# Patient Record
Sex: Male | Born: 1985 | Race: Black or African American | Hispanic: No | Marital: Married | Smoking: Never smoker
Health system: Southern US, Community
[De-identification: ages and names within clinical notes are randomized; demographics above are authoritative.]

---

## 2011-01-15 ENCOUNTER — Inpatient Hospital Stay (INDEPENDENT_AMBULATORY_CARE_PROVIDER_SITE_OTHER)
Admission: RE | Admit: 2011-01-15 | Discharge: 2011-01-15 | Disposition: A | Discharge status: Home / Self Care | Payer: BC Managed Care – PPO | Source: Ambulatory Visit | Attending: Family Medicine | Admitting: Family Medicine

## 2011-01-15 DIAGNOSIS — J019 Acute sinusitis, unspecified: Secondary | ICD-10-CM

## 2015-01-13 ENCOUNTER — Encounter (HOSPITAL_COMMUNITY): Payer: Self-pay | Admitting: Emergency Medicine

## 2015-01-13 DIAGNOSIS — W57XXXA Bitten or stung by nonvenomous insect and other nonvenomous arthropods, initial encounter: Secondary | ICD-10-CM | POA: Insufficient documentation

## 2015-01-13 DIAGNOSIS — R51 Headache: Secondary | ICD-10-CM | POA: Insufficient documentation

## 2015-01-13 DIAGNOSIS — Y998 Other external cause status: Secondary | ICD-10-CM | POA: Insufficient documentation

## 2015-01-13 DIAGNOSIS — H53149 Visual discomfort, unspecified: Secondary | ICD-10-CM | POA: Insufficient documentation

## 2015-01-13 DIAGNOSIS — T148 Other injury of unspecified body region: Secondary | ICD-10-CM | POA: Insufficient documentation

## 2015-01-13 DIAGNOSIS — Y9289 Other specified places as the place of occurrence of the external cause: Secondary | ICD-10-CM | POA: Diagnosis not present

## 2015-01-13 DIAGNOSIS — H5711 Ocular pain, right eye: Secondary | ICD-10-CM | POA: Insufficient documentation

## 2015-01-13 DIAGNOSIS — Y9389 Activity, other specified: Secondary | ICD-10-CM | POA: Insufficient documentation

## 2015-01-13 NOTE — ED Notes (Signed)
Pt. reports tick bite at right neck and right torso yesterday with chills, nausea and neck pain .

## 2015-01-14 ENCOUNTER — Emergency Department (HOSPITAL_COMMUNITY)
Admission: EM | Admit: 2015-01-14 | Discharge: 2015-01-14 | Disposition: A | Discharge status: Home / Self Care | Payer: 59 | Attending: Emergency Medicine | Admitting: Emergency Medicine

## 2015-01-14 DIAGNOSIS — W57XXXA Bitten or stung by nonvenomous insect and other nonvenomous arthropods, initial encounter: Secondary | ICD-10-CM

## 2015-01-14 MED ORDER — DOXYCYCLINE HYCLATE 100 MG PO CAPS: 100.0000 mg | ORAL_CAPSULE | Freq: Two times a day (BID) | ORAL | Status: DC

## 2015-01-14 MED ORDER — IBUPROFEN 800 MG PO TABS
800.0000 mg | ORAL_TABLET | Freq: Once | ORAL | Status: AC
Start: 2015-01-14 — End: 2015-01-14
  Administered 2015-01-14: 800 mg via ORAL
  Filled 2015-01-14: qty 1

## 2015-01-14 MED ORDER — METOCLOPRAMIDE HCL 10 MG PO TABS
10.0000 mg | ORAL_TABLET | Freq: Once | ORAL | Status: AC
  Administered 2015-01-14: 10 mg via ORAL
  Filled 2015-01-14: qty 1

## 2015-01-14 NOTE — ED Provider Notes (Signed)
CSN: [id number]     Arrival date & time 01/13/15  2311 History  This chart was scribed for Tomasita Crumble, MD by Freida Busman, ED Scribe. This patient was seen in room D30C/D30C and the patient's care was started 1:27 AM.    Chief Complaint  Patient presents with  . Insect Bite    HPI HPI Comments:  Randy Perez is a 29 y.o. male who presents to the Emergency Department complaining of a throbbing migraine HA and nausea for 3 days. He reports associated right eye pain and photophobia. Pt notes he removed 2 ticks from his person yesterday and is unsure how long the ticks had been there prior to removal. 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 No alleviating factors noted.  History reviewed. No pertinent past medical history. History reviewed. No pertinent past surgical history. No family history on file. History  Substance Use Topics  . Smoking status: Never Smoker   . Smokeless tobacco: Not on file  . Alcohol Use: No    Review of Systems  A complete 10 system review of systems was obtained and all systems are negative except as noted in the HPI and PMH.    Allergies  Review of patient's allergies indicates no known allergies.  Home Medications   Prior to Admission medications   Not on File   BP 155/111 mmHg  Pulse 98  Temp(Src) 98 F (36.7 C) (Oral)  Resp 24  Wt 268 lb 3.2 oz (121.655 kg)  SpO2 98% Physical Exam  Constitutional: He is oriented to person, place, and time. Vital signs are normal. He appears well-developed and well-nourished.  Non-toxic appearance. He does not appear ill. No distress.  HENT:  Head: Normocephalic and atraumatic.  Nose: Nose normal.  Mouth/Throat: Oropharynx is clear and moist. No oropharyngeal exudate.  Eyes: Conjunctivae and EOM are normal. Pupils are equal, round, and reactive to light. No scleral icterus.  Neck: Normal range of motion. Neck supple. No tracheal deviation, no edema, no erythema and normal range  of motion present. No thyroid mass and no thyromegaly present.  Cardiovascular: Normal rate, regular rhythm, S1 normal, S2 normal, normal heart sounds, intact distal pulses and normal pulses.  Exam reveals no gallop and no friction rub.   No murmur heard. Pulses:      Radial pulses are 2+ on the right side, and 2+ on the left side.       Dorsalis pedis pulses are 2+ on the right side, and 2+ on the left side.  Pulmonary/Chest: Effort normal and breath sounds normal. No respiratory distress. He has no wheezes. He has no rhonchi. He has no rales.  Abdominal: Soft. Normal appearance and bowel sounds are normal. He exhibits no distension, no ascites and no mass. There is no hepatosplenomegaly. There is no tenderness. There is no rebound, no guarding and no CVA tenderness.  Musculoskeletal: Normal range of motion. He exhibits no edema or tenderness.  Lymphadenopathy:    He has no cervical adenopathy.  Neurological: He is alert and oriented to person, place, and time. He has normal strength. No cranial nerve deficit or sensory deficit.  Skin: Skin is warm, dry and intact. No petechiae and no rash noted. He is not diaphoretic. No erythema. No pallor.  Psychiatric: He has a normal mood and affect. His behavior is normal. Judgment normal.  Nursing note and vitals reviewed.   ED Course  Procedures   DIAGNOSTIC STUDIES:  Oxygen Saturation is 98% on  RA, normal by my interpretation.    COORDINATION OF CARE:  1:32 AM Discussed treatment plan with pt at bedside and pt agreed to plan.  Labs Review Labs Reviewed - No data to display  Imaging Review No results found.   EKG Interpretation None      MDM   Final diagnoses:  None   Patient presents to the ED after finding a tick on his body.  He has since had HA and N/V.  Possible consistent with tick bourne illness.  No rash on exam.  Will give Rx for doxycycline but advised for watch and wait.  Tick was removed today and symptoms may improve  without intervention.  Advised to take motrin or tylenol for HA during the interval.  He appears well and in NAD.  His VS remain within his normal limits and he is safe for DC.  I personally performed the services described in this documentation, which was scribed in my presence. The recorded information has been reviewed and is accurate.    Tomasita CrumbleAdeleke Merisa Julio, MD 01/14/15 1341

## 2015-01-14 NOTE — Discharge Instructions (Signed)
Tick Bite Information Randy Perez, take ibuprofen $RemoveBefore'800mg'DOTiJdixnjTra$  as needed for headache up to 3 times per day.  Wait 2 days to see if your symptoms improve.  If not, start doxycycline antibiotic.  Be aware there are side effects in the sun and on an empty stomach.  Do not do these things while on the antibiotics.  See a primary care physician within 3 days for close follow-up. If symptoms worsen come back to the emergency department immediately. Thank you. Ticks are insects that attach themselves to the skin. There are many types of ticks. Common types include wood ticks and deer ticks. Sometimes, ticks carry diseases that can make a person very ill. The most common places for ticks to attach themselves are the scalp, neck, armpits, waist, and groin.  HOW CAN YOU PREVENT TICK BITES? Take these steps to help prevent tick bites when you are outdoors:  Wear long sleeves and long pants.  Wear white clothes so you can see ticks more easily.  Tuck your pant legs into your socks.  If walking on a trail, stay in the middle of the trail to avoid brushing against bushes.  Avoid walking through areas with long grass.  Put bug spray on all skin that is showing and along boot tops, pant legs, and sleeve cuffs.  Check clothes, hair, and skin often and before going inside.  Brush off any ticks that are not attached.  Take a shower or bath as soon as possible after being outdoors. HOW SHOULD YOU REMOVE A TICK? Ticks should be removed as soon as possible to help prevent diseases. 1. If latex gloves are available, put them on before trying to remove a tick. 2. Use tweezers to grasp the tick as close to the skin as possible. You may also use curved forceps or a tick removal tool. Grasp the tick as close to its head as possible. Avoid grasping the tick on its body. 3. Pull gently upward until the tick lets go. Do not twist the tick or jerk it suddenly. This may break off the tick's head or mouth parts. 4. Do not  squeeze or crush the tick's body. This could force disease-carrying fluids from the tick into your body. 5. After the tick is removed, wash the bite area and your hands with soap and water or alcohol. 6. Apply a small amount of antiseptic cream or ointment to the bite site. 7. Wash any tools that were used. Do not try to remove a tick by applying a hot match, petroleum jelly, or fingernail polish to the tick. These methods do not work. They may also increase the chances of disease being spread from the tick bite. WHEN SHOULD YOU SEEK HELP? Contact your health care provider if you are unable to remove a tick or if a part of the tick breaks off in the skin. After a tick bite, you need to watch for signs and symptoms of diseases that can be spread by ticks. Contact your health care provider if you develop any of the following:  Fever.  Rash.  Redness and puffiness (swelling) in the area of the tick bite.  Tender, puffy lymph glands.  Watery poop (diarrhea).  Weight loss.  Cough.  Feeling more tired than normal (fatigue).  Muscle, joint, or bone pain.  Belly (abdominal) pain.  Headache.  Change in your level of consciousness.  Trouble walking or moving your legs.  Loss of feeling (numbness) in the legs.  Loss of movement (paralysis).  Shortness  of breath.  Confusion.  Throwing up (vomiting) many times. Document Released: 09/12/2009 Document Revised: 02/18/2013 Document Reviewed: 11/26/2012 [facility] Patient Information 2015 [place], [place]. This information is not intended to replace advice given to you by your health care provider. Make sure you discuss any questions you have with your health care provider.  Doxycycline tablets or capsules What is this medicine? DOXYCYCLINE (dox i SYE kleen) is a tetracycline antibiotic. It kills certain bacteria or stops their growth. It is used to treat many kinds of infections, like dental, skin, respiratory, and urinary tract  infections. It also treats acne, Lyme disease, malaria, and certain sexually transmitted infections. This medicine may be used for other purposes; ask your health care provider or pharmacist if you have questions. COMMON BRAND NAME(S): Acticlate, Adoxa, Adoxa CK, Adoxa Pak, Adoxa TT, Alodox, Avidoxy, Doxal, Monodox, Morgidox 1x, Morgidox 1x Kit, Morgidox 2x, Morgidox 2x Kit, Ocudox, Vibra-Tabs, Vibramycin What should I tell my health care provider before I take this medicine? They need to know if you have any of these conditions: -liver disease -long exposure to sunlight like working outdoors -stomach problems like colitis -an unusual or allergic reaction to doxycycline, tetracycline antibiotics, other medicines, foods, dyes, or preservatives -pregnant or trying to get pregnant -breast-feeding How should I use this medicine? Take this medicine by mouth with a full glass of water. Follow the directions on the prescription label. It is best to take this medicine without food, but if it upsets your stomach take it with food. Take your medicine at regular intervals. Do not take your medicine more often than directed. Take all of your medicine as directed even if you think you are better. Do not skip doses or stop your medicine early. Talk to your pediatrician regarding the use of this medicine in children. Special care may be needed. While this drug may be prescribed for children as young as 38 years old for selected conditions, precautions do apply. Overdosage: If you think you have taken too much of this medicine contact a poison control center or emergency room at once. NOTE: This medicine is only for you. Do not share this medicine with others. What if I miss a dose? If you miss a dose, take it as soon as you can. If it is almost time for your next dose, take only that dose. Do not take double or extra doses. What may interact with this medicine? -antacids -barbiturates -birth control  pills -bismuth subsalicylate -carbamazepine -methoxyflurane -other antibiotics -phenytoin -vitamins that contain iron -warfarin This list may not describe all possible interactions. Give your health care provider a list of all the medicines, herbs, non-prescription drugs, or dietary supplements you use. Also tell them if you smoke, drink alcohol, or use illegal drugs. Some items may interact with your medicine. What should I watch for while using this medicine? Tell your doctor or health care professional if your symptoms do not improve. Do not treat diarrhea with over the counter products. Contact your doctor if you have diarrhea that lasts more than 2 days or if it is severe and watery. Do not take this medicine just before going to bed. It may not dissolve properly when you lay down and can cause pain in your throat. Drink plenty of fluids while taking this medicine to also help reduce irritation in your throat. This medicine can make you more sensitive to the sun. Keep out of the sun. If you cannot avoid being in the sun, wear protective clothing and use sunscreen.  Do not use sun lamps or tanning beds/booths. Birth control pills may not work properly while you are taking this medicine. Talk to your doctor about using an extra method of birth control. If you are being treated for a sexually transmitted infection, avoid sexual contact until you have finished your treatment. Your sexual partner may also need treatment. Avoid antacids, aluminum, calcium, magnesium, and iron products for 4 hours before and 2 hours after taking a dose of this medicine. If you are using this medicine to prevent malaria, you should still protect yourself from contact with mosquitos. Stay in screened-in areas, use mosquito nets, keep your body covered, and use an insect repellent. What side effects may I notice from receiving this medicine? Side effects that you should report to your doctor or health care professional  as soon as possible: -allergic reactions like skin rash, itching or hives, swelling of the face, lips, or tongue -difficulty breathing -fever -itching in the rectal or genital area -pain on swallowing -redness, blistering, peeling or loosening of the skin, including inside the mouth -severe stomach pain or cramps -unusual bleeding or bruising -unusually weak or tired -yellowing of the eyes or skin Side effects that usually do not require medical attention (report to your doctor or health care professional if they continue or are bothersome): -diarrhea -loss of appetite -nausea, vomiting This list may not describe all possible side effects. Call your doctor for medical advice about side effects. You may report side effects to FDA at 1-800-FDA-1088. Where should I keep my medicine? Keep out of the reach of children. Store at room temperature, below 30 degrees C (86 degrees F). Protect from light. Keep container tightly closed. Throw away any unused medicine after the expiration date. Taking this medicine after the expiration date can make you seriously ill. NOTE: This sheet is a summary. It may not cover all possible information. If you have questions about this medicine, talk to your doctor, pharmacist, or health care provider.  2015, [employer]. (2013-04-24 13:58:06)

## 2015-08-03 ENCOUNTER — Emergency Department (INDEPENDENT_AMBULATORY_CARE_PROVIDER_SITE_OTHER)
Admission: EM | Admit: 2015-08-03 | Discharge: 2015-08-03 | Disposition: A | Discharge status: Home / Self Care | Payer: 59 | Source: Home / Self Care | Attending: Family Medicine | Admitting: Family Medicine

## 2015-08-03 ENCOUNTER — Encounter (HOSPITAL_COMMUNITY): Payer: Self-pay | Admitting: Emergency Medicine

## 2015-08-03 DIAGNOSIS — J069 Acute upper respiratory infection, unspecified: Secondary | ICD-10-CM | POA: Diagnosis not present

## 2015-08-03 DIAGNOSIS — B309 Viral conjunctivitis, unspecified: Secondary | ICD-10-CM | POA: Diagnosis not present

## 2015-08-03 NOTE — ED Notes (Signed)
Stuffy nose, both eyes red, both eyes are itching.  Left eye redder than right eye, tearing.  Patient reports congestion in head, no chest congestion.  Onset of symptoms was 1/30

## 2015-08-03 NOTE — ED Provider Notes (Signed)
CSN: [id number]     Arrival date & time 08/03/15  1753 History   First MD Initiated Contact with Patient 08/03/15 1952     Chief Complaint  Patient presents with  . 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 He is taking an OTC medication which is helping with his symptoms. Chief complaint for visit tonight is that of redness, itching and clear watery drainage from the left eye. He states it is interfering with his driving on his job working with [employer]. No problems with vision except when there is drainage in the eye.   History reviewed. No pertinent past medical history. History reviewed. No pertinent past surgical history. No family history on file. Social History  Substance Use Topics  . Smoking status: Never Smoker   . Smokeless tobacco: None  . Alcohol Use: No    Review of Systems  Constitutional: Negative for fever, diaphoresis, activity change and fatigue.  HENT: Positive for postnasal drip and sore throat. Negative for ear pain, facial swelling, rhinorrhea and trouble swallowing.   Eyes: Positive for discharge and redness. Negative for photophobia and pain.  Respiratory: Positive for cough. Negative for chest tightness and shortness of breath.   Cardiovascular: Negative.   Gastrointestinal: Negative.   Musculoskeletal: Negative.  Negative for neck pain and neck stiffness.  Neurological: Negative.     Allergies  Review of patient's allergies indicates no known allergies.  Home Medications   Prior to Admission medications   Medication Sig Start Date End Date Taking? Authorizing Provider  Phenylephrine-APAP-Guaifenesin (TYLENOL SINUS SEVERE PO) Take by mouth.   Yes Historical Provider, MD  doxycycline (VIBRAMYCIN) 100 MG capsule Take 1 capsule (100 mg total) by mouth 2 (two) times daily. One  po bid x 10 days Patient not taking: Reported on 08/03/2015 01/14/15   Tomasita Crumble, MD   Meds Ordered and Administered this Visit  Medications - No data to display  BP 153/82 mmHg  Pulse 105  Temp(Src) 98.1 F (36.7 C) (Oral)  Resp 18  SpO2 98% No data found.   Physical Exam  Constitutional: He is oriented to person, place, and time. He appears well-developed and well-nourished. No distress.  HENT:  Bilateral TMs are normal Oropharynx with minor erythema and scant clear PND. No exudates  Eyes:  Left lower lid conjunctivae but mildly swollen and erythematous. No purulence or other drainage. Sclera mildly injected. Right lower lid conjunctiva with minor erythema. No drainage  Neck: Normal range of motion. Neck supple.  Cardiovascular: Normal rate.   Pulmonary/Chest: Effort normal.  Musculoskeletal: Normal range of motion.  Lymphadenopathy:    He has no cervical adenopathy.  Neurological: He is alert and oriented to person, place, and time. He exhibits normal muscle tone.  Skin: Skin is warm and dry.  Nursing note and vitals reviewed.   ED Course  Procedures (including critical care time)  Labs Review Labs Reviewed - No data to display  Imaging Review No results found.   Visual Acuity Review  Right Eye Distance:   Left Eye Distance:   Bilateral Distance:    Right Eye Near:   Left Eye Near:    Bilateral Near:         MDM   1. URI (upper respiratory infection)   2. Acute viral conjunctivitis of both eyes     zaditor and naphcon-A eye drops ou as directed Cont OTC URI meds Lots  of fluids. warm compresses to eyes.       Hayden Rasmussen, NP 08/03/15 2012

## 2015-08-03 NOTE — Discharge Instructions (Signed)
Viral Conjunctivitis Zaditor eye drops, one drop each eye twice a day Naphcon-A or Vasocon-A 1 drop each eye 4 times a day. Warm compresses. Viral conjunctivitis is an inflammation of the clear membrane that covers the white part of your eye and the inner surface of your eyelid (conjunctiva). The inflammation is caused by a viral infection. The blood vessels in the conjunctiva become inflamed, causing the eye to become red or pink, and often itchy. Viral conjunctivitis can easily be passed from one person to another (contagious). CAUSES  Viral conjunctivitis is caused by a virus. A virus is a type of contagious germ. It can be spread by touching objects that have been contaminated with the virus, such as doorknobs or towels.  SYMPTOMS  Symptoms of viral conjunctivitis may include:   Eye redness.  Tearing or watery eyes.  Itchy eyes.  Burning feeling in the eyes.  Clear drainage from the eye.  Swollen eyelids.  A gritty feeling in the eye.  Light sensitivity. DIAGNOSIS  Viral conjunctivitis may be diagnosed with a medical history and physical exam. If you have discharge from your eye, the discharge may be tested to rule out other causes of conjunctivitis.  TREATMENT  Viral conjunctivitis does not respond to medicines that kill bacteria (antibiotics). Treatment for viral conjunctivitis is directed at stopping a bacterial infection from developing in addition to the viral infection. Treatment also aims to relieve your symptoms, such as itching. This may be done with antihistamine drops or other eye medicines. HOME CARE INSTRUCTIONS  Take medicines only as directed by your health care provider.  Avoid touching or rubbing your eyes.  Apply a warm, clean washcloth to your eye for 10-20 minutes, 3-4 times per day.  If you wear contact lenses, do not wear them until the inflammation is gone and your health care provider says it is safe to wear them again. Ask your health care provider  how to sterilize or replace your contact lenses before using them again. Wear glasses until you can resume wearing contacts.  Avoid wearing eye makeup until the inflammation is gone. Throw away any old eye cosmetics that may be contaminated.  Change or wash your pillowcase every day.  Do not share towels or washcloths. This may spread the infection.  Wash your hands often with soap and water. Use paper towels to dry your hands.  Gently wipe away any drainage from your eye with a warm, wet washcloth or a cotton ball.  Be very careful to avoid touching the edge of the eyelid with the eye drop bottle or ointment tube when applying medicines to the affected eye. This will stop you from spreading the infection to the other eye or to other people. SEEK MEDICAL CARE IF:   Your symptoms do not improve with treatment.  You have increased pain.  Your vision becomes blurry.  You have a fever.  You have facial pain, redness, or swelling.  You have new symptoms.  Your symptoms get worse.   This information is not intended to replace advice given to you by your health care provider. Make sure you discuss any questions you have with your health care provider.   Document Released: 09/08/2002 Document Revised: 12/10/2005 Document Reviewed: 03/30/2014 Elsevier Interactive Patient Education 2016 [employer].     How to Use Eye Drops and Eye Ointments HOW TO APPLY EYE DROPS Follow these steps when applying eye drops:  Wash your hands.  Tilt your head back.  Put a finger under your  eye and use it to gently pull your lower lid downward. Keep that finger in place.  Using your other hand, hold the dropper between your thumb and index finger.  Position the dropper just over the edge of the lower lid. Hold it as close to your eye as you can without touching the dropper to your eye.  Steady your hand. One way to do this is to lean your index finger against your brow.  Look up.  Slowly  and gently squeeze one drop of medicine into your eye.  Close your eye.  Place a finger between your lower eyelid and your nose. Press gently for 2 minutes. This increases the amount of time that the medicine is exposed to the eye. It also reduces side effects that can develop if the drop gets into the bloodstream through the nose. HOW TO APPLY EYE OINTMENTS Follow these steps when applying eye ointments:  Wash your hands.  Put a finger under your eye and use it to gently pull your lower lid downward. Keep that finger in place.  Using your other hand, place the tip of the tube between your thumb and index finger with the remaining fingers braced against your cheek or nose.  Hold the tube just over the edge of your lower lid without touching the tube to your lid or eyeball.  Look up.  Line the inner part of your lower lid with ointment.  Gently pull up on your upper lid and look down. This will force the ointment to spread over the surface of the eye.  Release the upper lid.  If you can, close your eyes for 1-2 minutes. Do not rub your eyes. If you applied the ointment correctly, your vision will be blurry for a few minutes. This is normal. ADDITIONAL INFORMATION  Make sure to use the eye drops or ointment as told by your health care provider.  If you have been told to use both eye drops and an eye ointment, apply the eye drops first, then wait 3-4 minutes before you apply the ointment.  Try not to touch the tip of the dropper or tube to your eye. A dropper or tube that has touched the eye can become contaminated.   This information is not intended to replace advice given to you by your health care provider. Make sure you discuss any questions you have with your health care provider.   Document Released: 09/24/2000 Document Revised: 11/02/2014 Document Reviewed: 06/14/2014 Elsevier Interactive Patient Education 2016 [employer]  Upper Respiratory Infection, Adult Most upper  respiratory infections (URIs) are a viral infection of the air passages leading to the lungs. A URI affects the nose, throat, and upper air passages. The most common type of URI is nasopharyngitis and is typically referred to as "the common cold." URIs run their course and usually go away on their own. Most of the time, a URI does not require medical attention, but sometimes a bacterial infection in the upper airways can follow a viral infection. This is called a secondary infection. Sinus and middle ear infections are common types of secondary upper respiratory infections. Bacterial pneumonia can also complicate a URI. A URI can worsen asthma and chronic obstructive pulmonary disease (COPD). Sometimes, these complications can require emergency medical care and may be life threatening.  CAUSES Almost all URIs are caused by viruses. A virus is a type of germ and can spread from one person to another.  RISKS FACTORS You may be at risk for  a URI if:   You smoke.   You have chronic heart or lung disease.  You have a weakened defense (immune) system.   You are very young or very old.   You have nasal allergies or asthma.  You work in crowded or poorly ventilated areas.  You work in health care facilities or schools. SIGNS AND SYMPTOMS  Symptoms typically develop 2-3 days after you come in contact with a cold virus. Most viral URIs last 7-10 days. However, viral URIs from the influenza virus (flu virus) can last 14-18 days and are typically more severe. Symptoms may include:   Runny or stuffy (congested) nose.   Sneezing.   Cough.   Sore throat.   Headache.   Fatigue.   Fever.   Loss of appetite.   Pain in your forehead, behind your eyes, and over your cheekbones (sinus pain).  Muscle aches.  DIAGNOSIS  Your health care provider may diagnose a URI by:  Physical exam.  Tests to check that your symptoms are not due to another condition such as:  Strep  throat.  Sinusitis.  Pneumonia.  Asthma. TREATMENT  A URI goes away on its own with time. It cannot be cured with medicines, but medicines may be prescribed or recommended to relieve symptoms. Medicines may help:  Reduce your fever.  Reduce your cough.  Relieve nasal congestion. HOME CARE INSTRUCTIONS   Take medicines only as directed by your health care provider.   Gargle warm saltwater or take cough drops to comfort your throat as directed by your health care provider.  Use a warm mist humidifier or inhale steam from a shower to increase air moisture. This may make it easier to breathe.  Drink enough fluid to keep your urine clear or pale yellow.   Eat soups and other clear broths and maintain good nutrition.   Rest as needed.   Return to work when your temperature has returned to normal or as your health care provider advises. You may need to stay home longer to avoid infecting others. You can also use a face mask and careful hand washing to prevent spread of the virus.  Increase the usage of your inhaler if you have asthma.   Do not use any tobacco products, including cigarettes, chewing tobacco, or electronic cigarettes. If you need help quitting, ask your health care provider. PREVENTION  The best way to protect yourself from getting a cold is to practice good hygiene.   Avoid oral or hand contact with people with cold symptoms.   Wash your hands often if contact occurs.  There is no clear evidence that vitamin C, vitamin E, echinacea, or exercise reduces the chance of developing a cold. However, it is always recommended to get plenty of rest, exercise, and practice good nutrition.  SEEK MEDICAL CARE IF:   You are getting worse rather than better.   Your symptoms are not controlled by medicine.   You have chills.  You have worsening shortness of breath.  You have brown or red mucus.  You have yellow or brown nasal discharge.  You have pain in your  face, especially when you bend forward.  You have a fever.  You have swollen neck glands.  You have pain while swallowing.  You have white areas in the back of your throat. SEEK IMMEDIATE MEDICAL CARE IF:   You have severe or persistent:  Headache.  Ear pain.  Sinus pain.  Chest pain.  You have chronic lung disease and  any of the following:  Wheezing.  Prolonged cough.  Coughing up blood.  A change in your usual mucus.  You have a stiff neck.  You have changes in your:  Vision.  Hearing.  Thinking.  Mood. MAKE SURE YOU:   Understand these instructions.  Will watch your condition.  Will get help right away if you are not doing well or get worse.   This information is not intended to replace advice given to you by your health care provider. Make sure you discuss any questions you have with your health care provider.   Document Released: 12/12/2000 Document Revised: 11/02/2014 Document Reviewed: 09/23/2013 Elsevier Interactive Patient Education [employer].

## 2016-08-25 ENCOUNTER — Ambulatory Visit (HOSPITAL_COMMUNITY)
Admission: EM | Admit: 2016-08-25 | Discharge: 2016-08-25 | Disposition: A | Discharge status: Home / Self Care | Payer: 59 | Attending: Family Medicine | Admitting: Family Medicine

## 2016-08-25 ENCOUNTER — Encounter (HOSPITAL_COMMUNITY): Payer: Self-pay | Admitting: Emergency Medicine

## 2016-08-25 ENCOUNTER — Ambulatory Visit (INDEPENDENT_AMBULATORY_CARE_PROVIDER_SITE_OTHER): Payer: 59

## 2016-08-25 DIAGNOSIS — J209 Acute bronchitis, unspecified: Secondary | ICD-10-CM

## 2016-08-25 MED ORDER — HYDROCOD POLST-CPM POLST ER 10-8 MG/5ML PO SUER: 5.0000 mL | Freq: Two times a day (BID) | ORAL | 0 refills | Status: AC | PRN

## 2016-08-25 NOTE — ED Provider Notes (Signed)
CSN: [id number]     Arrival date & time 08/25/16  1828 History   First MD Initiated Contact with Patient 08/25/16 1901     Chief Complaint  Patient presents with  . Cough   (Consider location/radiation/quality/duration/timing/severity/associated sxs/prior Treatment) Patient is healthy 31 y.o. Male, presents today for 653-month duration of cough with no improvement despite OTC cold medication including robitussin and Tylenol cold. Cough is productive. He reports the cough has gotten worse. He states the cough has gotten to the point that it is affecting his qualify of sleep at night. He otherwise denies wheezing, SOB, congestion, running nose. He does reports CP due to excessive coughing.        History reviewed. No pertinent past medical history. History reviewed. No pertinent surgical history. No family history on file. Social History  Substance Use Topics  . Smoking status: Never Smoker  . Smokeless tobacco: Never Used  . Alcohol use No    Review of Systems  Constitutional:       As stated in the HPI    Allergies  Patient has no known allergies.  Home Medications   Prior to Admission medications   Medication Sig Start Date End Date Taking? Authorizing Provider  chlorpheniramine-HYDROcodone (TUSSIONEX PENNKINETIC ER) 10-8 MG/5ML SUER Take 5 mLs by mouth every 12 (twelve) hours as needed for cough. 08/25/16 09/01/16  Lucia EstelleFeng Kilani Joffe, NP  doxycycline (VIBRAMYCIN) 100 MG capsule Take 1 capsule (100 mg total) by mouth 2 (two) times daily. One po bid x 10 days Patient not taking: Reported on 08/03/2015 01/14/15   Tomasita CrumbleAdeleke Oni, MD  Phenylephrine-APAP-Guaifenesin (TYLENOL SINUS SEVERE PO) Take by mouth.    Historical Provider, MD   Meds Ordered and Administered this Visit  Medications - No data to display  BP 136/86   Pulse 104   Temp 98.8 F (37.1 C) (Temporal)   Resp 16   Ht 5\' 8"  (1.727 m)   Wt 258 lb (117 kg)   SpO2 100%   BMI 39.23 kg/m  No data found.   Physical Exam   Constitutional: He is oriented to person, place, and time. He appears well-developed and well-nourished.  HENT:  Head: Normocephalic and atraumatic.  Right Ear: External ear normal.  Left Ear: External ear normal.  Nose: Nose normal.  Mouth/Throat: Oropharynx is clear and moist. No oropharyngeal exudate.  TM pearly gray with no erythema  Eyes: Conjunctivae are normal. Pupils are equal, round, and reactive to light.  Neck: Normal range of motion. Neck supple.  Cardiovascular: Normal rate, regular rhythm and normal heart sounds.   Pulmonary/Chest: Breath sounds normal. No respiratory distress. He has no wheezes.  Abdominal: Soft. Bowel sounds are normal. He exhibits no distension. There is no tenderness.  Lymphadenopathy:    He has no cervical adenopathy.  Neurological: He is alert and oriented to person, place, and time.  Skin: Skin is warm and dry.  Nursing note and vitals reviewed.   Urgent Care Course     Procedures (including critical care time)  Labs Review Labs Reviewed - No data to display  Imaging Review Dg Chest 2 View  Result Date: 08/25/2016 CLINICAL DATA:  Patient states that he's had cough x 3 weeks, non-productive cough. chest pain with cough. Sinus congestion. Non-smoker EXAM: CHEST  2 VIEW COMPARISON:  None. FINDINGS: The heart size and mediastinal contours are within normal limits. Both lungs are clear. No pleural effusion or pneumothorax. The visualized skeletal structures are unremarkable. IMPRESSION: Normal chest radiographs. Electronically Signed  By: Amie Portland M.D.   On: 08/25/2016 19:52     MDM   1. Acute bronchitis, unspecified organism    Chest xray unremarkable.  RX tussionex given.  May use OTC mucinex.  Reviewed directions for usage and side effects. Patient states understanding and will call with questions or problems. Patient instructed to call or follow up with his/her primary care doctor if failure to improve or change in symptoms.  Discharge instruction given.     Lucia Estelle, NP 08/25/16 1958

## 2016-08-25 NOTE — ED Triage Notes (Signed)
PT reports chest congestion, sinus problems, and non productive cough for 1 month. PT reports he has tried tylenol cold and flu and robitussin

## 2017-11-07 ENCOUNTER — Emergency Department (HOSPITAL_BASED_OUTPATIENT_CLINIC_OR_DEPARTMENT_OTHER)
Admission: EM | Admit: 2017-11-07 | Discharge: 2017-11-07 | Disposition: A | Discharge status: Home / Self Care | Payer: Managed Care, Other (non HMO) | Attending: Emergency Medicine | Admitting: Emergency Medicine

## 2017-11-07 ENCOUNTER — Other Ambulatory Visit: Payer: Self-pay

## 2017-11-07 ENCOUNTER — Encounter (HOSPITAL_BASED_OUTPATIENT_CLINIC_OR_DEPARTMENT_OTHER): Payer: Self-pay

## 2017-11-07 DIAGNOSIS — Z202 Contact with and (suspected) exposure to infections with a predominantly sexual mode of transmission: Secondary | ICD-10-CM

## 2017-11-07 DIAGNOSIS — Z79899 Other long term (current) drug therapy: Secondary | ICD-10-CM | POA: Insufficient documentation

## 2017-11-07 MED ORDER — METRONIDAZOLE 500 MG PO TABS
2000.0000 mg | ORAL_TABLET | Freq: Once | ORAL | Status: AC
  Administered 2017-11-07: 2000 mg via ORAL
  Filled 2017-11-07: qty 4

## 2017-11-07 NOTE — ED Provider Notes (Signed)
[facility] EMERGENCY DEPARTMENT Provider Note   CSN: [id number] Arrival date & time: 11/07/17  2002     History   Chief Complaint Chief Complaint  Patient presents with  . Groin Injury    HPI Randy Perez is a 32 y.o. male.  HPI Patient presents to the emergency department for returns about being exposed to trichomonas.  Patient states that his wife called him today and stated that she tested positive for trichomonas.  Patient states that he has had no symptoms of any kind.  Patient is mainly concerned about how this may have occurred.  Patient states that he did not take any medications prior to arrival. History reviewed. No pertinent past medical history.  There are no active problems to display for this patient.   History reviewed. No pertinent surgical history.      Home Medications    Prior to Admission medications   Medication Sig Start Date End Date Taking? Authorizing Provider  doxycycline (VIBRAMYCIN) 100 MG capsule Take 1 capsule (100 mg total) by mouth 2 (two) times daily. One po bid x 10 days Patient not taking: Reported on 08/03/2015 01/14/15   Tomasita Crumble, MD  Phenylephrine-APAP-Guaifenesin (TYLENOL SINUS SEVERE PO) Take by mouth.    [provider]    Family History No family history on file.  Social History Social History   Tobacco Use  . Smoking status: Never Smoker  . Smokeless tobacco: Never Used  Substance Use Topics  . Alcohol use: No  . Drug use: No     Allergies   Patient has no known allergies.   Review of Systems Review of Systems All other systems negative except as documented in the HPI. All pertinent positives and negatives as reviewed in the HPI.  Physical Exam Updated Vital Signs BP (!) 160/96 (BP Location: Left Arm)   Pulse (!) 105   Temp 98.2 F (36.8 C) (Oral)   Resp 20   Wt 126.9 kg (279 lb 12.2 oz)   SpO2 100%   BMI 42.54 kg/m   Physical Exam  Constitutional: He is oriented to person,  place, and time. He appears well-developed and well-nourished. No distress.  HENT:  Head: Normocephalic and atraumatic.  Eyes: Pupils are equal, round, and reactive to light.  Pulmonary/Chest: Effort normal.  Neurological: He is alert and oriented to person, place, and time.  Skin: Skin is warm and dry.  Psychiatric: He has a normal mood and affect.  Nursing note and vitals reviewed.    ED Treatments / Results  Labs (all labs ordered are listed, but only abnormal results are displayed) Labs Reviewed - No data to display  EKG None  Radiology No results found.  Procedures Procedures (including critical care time)  Medications Ordered in ED Medications  metroNIDAZOLE (FLAGYL) tablet 2,000 mg (2,000 mg Oral Given 11/07/17 2255)     Initial Impression / Assessment and Plan / ED Course  I have reviewed the triage vital signs and the nursing notes.  Pertinent labs & imaging results that were available during my care of the patient were reviewed by me and considered in my medical decision making (see chart for details).     Patient will be treated for trichomonas.  Patient is advised to return here as needed.  Final Clinical Impressions(s) / ED Diagnoses   Final diagnoses:  None    ED Discharge Orders    None       Charlestine Night, Cordelia Poche 11/07/17 2303    Ranae Palms,  Onalee Hua, MD 11/08/17 2956

## 2017-11-07 NOTE — Discharge Instructions (Addendum)
Return here as needed.  Follow-up with your primary doctor. °

## 2017-11-07 NOTE — ED Triage Notes (Addendum)
Pt states an approx 15lb door from a lock box he was taking off at work was pulled off and injured left groin area approx 430pm today-NAD-steady gait

## 2017-11-07 NOTE — ED Notes (Signed)
Pt understood dc material. NAD noted. 

## 2018-01-26 IMAGING — DX DG CHEST 2V
2 series · 2 of 2 positions shown · non-contrast
Comparison: None.

CLINICAL DATA: Patient states that he's had cough x 3 weeks,
non-productive cough.. chest pain with cough. Sinus congestion.
Non-smoker

EXAM:
CHEST  2 VIEW

[chest pa]
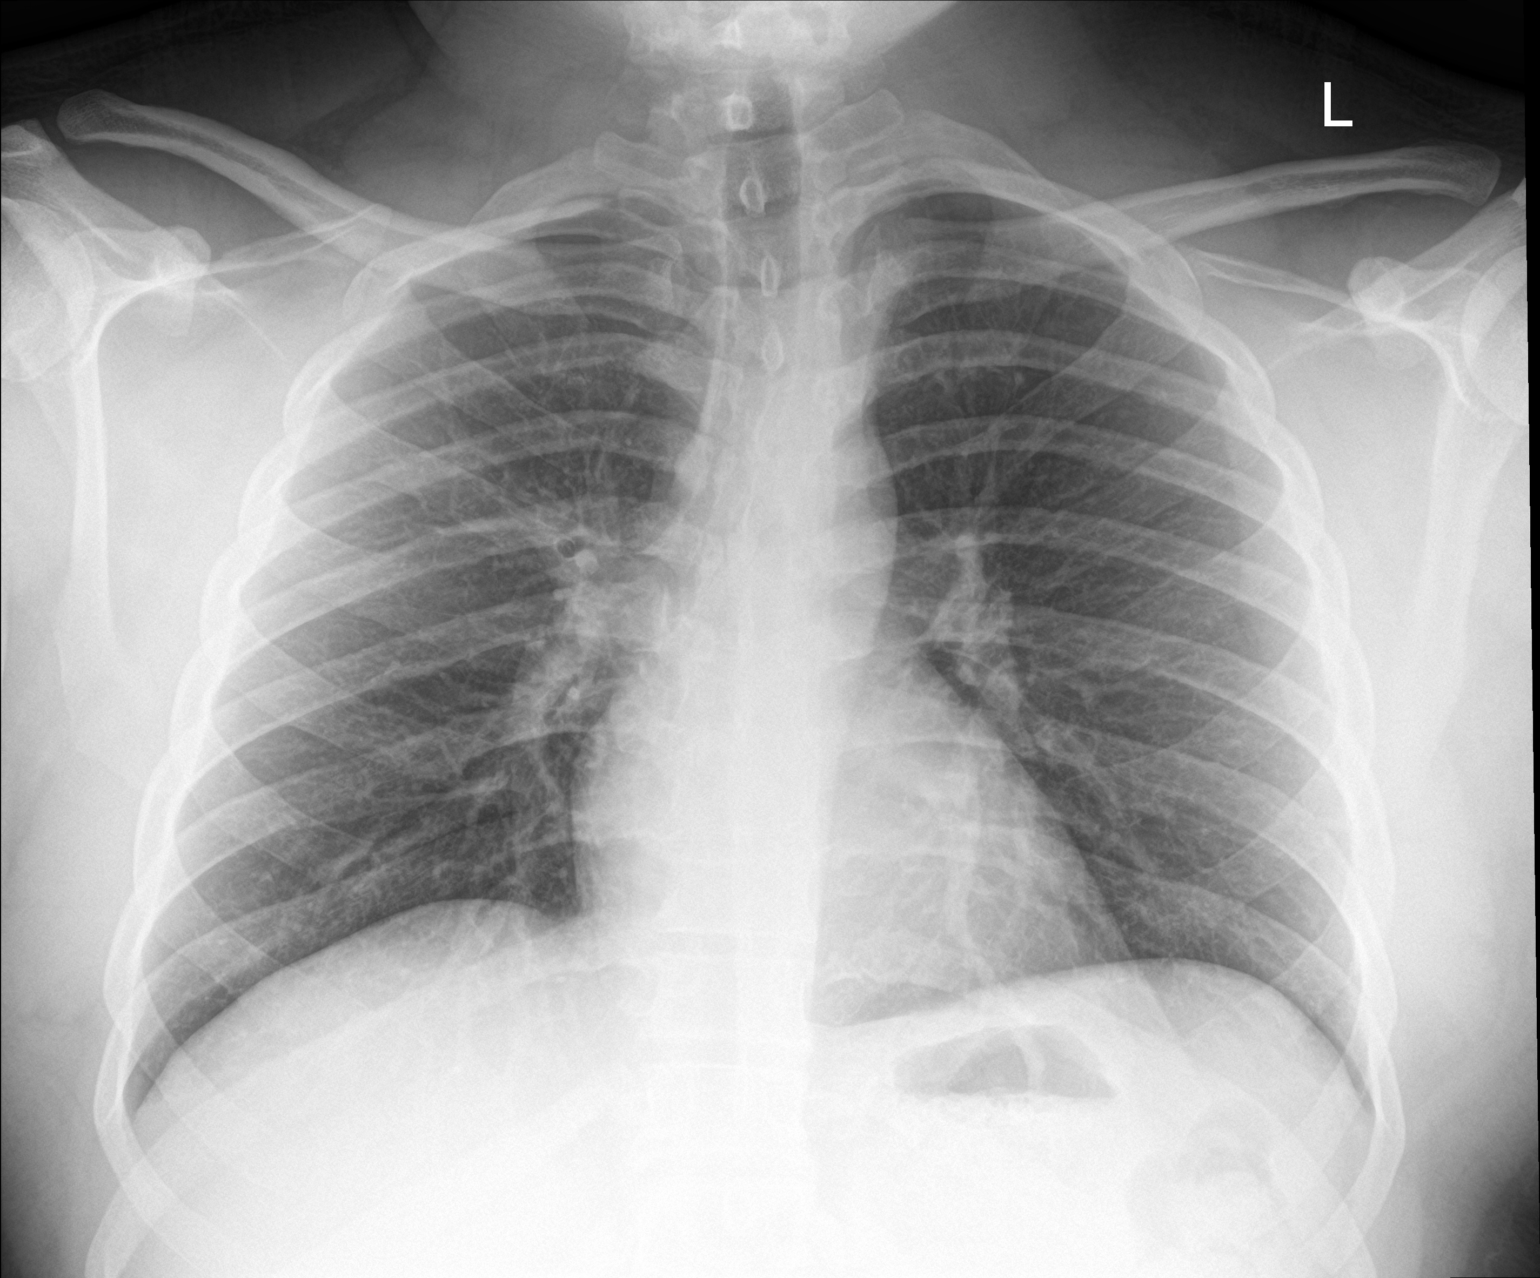

[chest lat]
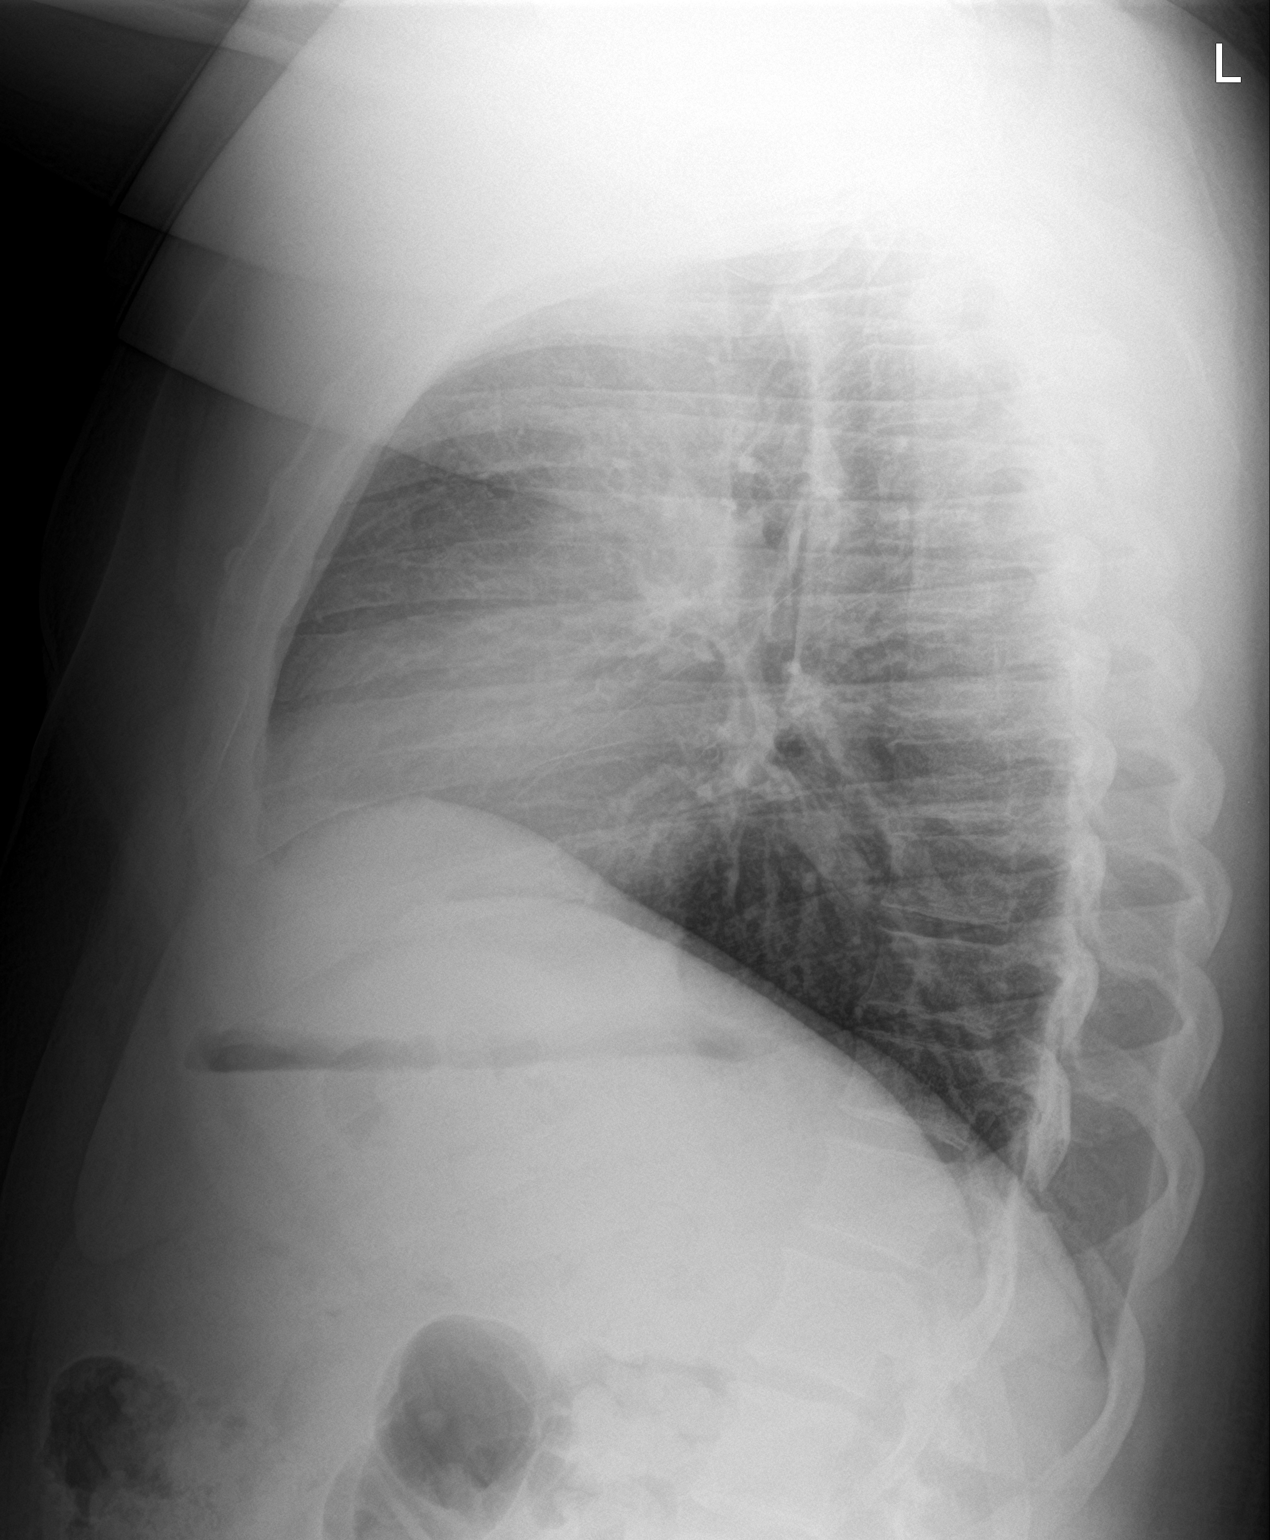

[2 of 2 positions shown; findings below may reference images not displayed]

FINDINGS: The heart size and mediastinal contours are within normal limits.
Both lungs are clear. No pleural effusion or pneumothorax. The
visualized skeletal structures are unremarkable.
IMPRESSION: Normal chest radiographs.

## 2022-08-22 ENCOUNTER — Ambulatory Visit
Admission: EM | Admit: 2022-08-22 | Discharge: 2022-08-22 | Disposition: A | Discharge status: Home / Self Care | Payer: Managed Care, Other (non HMO) | Attending: Family Medicine | Admitting: Family Medicine

## 2022-08-22 ENCOUNTER — Ambulatory Visit: Payer: Managed Care, Other (non HMO)

## 2022-08-22 DIAGNOSIS — M25561 Pain in right knee: Secondary | ICD-10-CM

## 2022-08-22 MED ORDER — DICLOFENAC SODIUM 75 MG PO TBEC: 75.0000 mg | DELAYED_RELEASE_TABLET | Freq: Two times a day (BID) | ORAL | 0 refills | Status: DC

## 2022-08-22 MED ORDER — PREDNISONE 20 MG PO TABS: 40.0000 mg | ORAL_TABLET | Freq: Every day | ORAL | 0 refills | Status: DC

## 2022-08-22 NOTE — ED Provider Notes (Signed)
Randy Perez   [id number] 08/22/22 Arrival Time: 0932  ASSESSMENT & PLAN:  1. Acute pain of right knee   No knee instability. I have personally viewed the imaging studies ordered this visit. No bony changes of R knee appreciated.  WBAT. Knee sleeve. Trial of: Discharge Medication List as of 08/22/2022 11:21 AM     START taking these medications   Details  diclofenac (VOLTAREN) 75 MG EC tablet Take 1 tablet (75 mg total) by mouth 2 (two) times daily., Starting Wed 08/22/2022, Normal    predniSONE (DELTASONE) 20 MG tablet Take 2 tablets (40 mg total) by mouth daily., Starting Wed 08/22/2022, Normal        Orders Placed This Encounter  Procedures   DG Knee Complete 4 Views Right   Apply knee brace/sleeve    Work/school excuse note: provided. Recommend:  Follow-up Information     Schedule an appointment as soon as possible for a visit  with [facility].   Specialty: Specialist Contact information: [address] [place] [address] [phone]                Reviewed expectations re: course of current medical issues. Questions answered. Outlined signs and symptoms indicating need for more acute intervention. Patient verbalized understanding. After Visit Summary given.  SUBJECTIVE: History from: patient. Randy Perez is a 37 y.o. male who reports R knee pain; gradual onset; x 2 weeks. Ques if he hit knee vs ladder at work; [employer]; climbing and kneeling a lot. Denies swelling. No extremity sensation changes or weakness. No tx PTA. No h/o knee problems.  History reviewed. No pertinent surgical history.    OBJECTIVE:  Vitals:   08/22/22 1042  BP: (!) 142/97  Pulse: 95  Resp: 16  Temp: 98.4 F (36.9 C)  TempSrc: Oral  SpO2: 100%    General appearance: alert; no distress HEENT: [place]; AT Neck: supple with FROM Resp: unlabored respirations Extremities: RLE: warm with well perfused appearance; no specific bony TTP over  right knee; without gross deformities; swelling: none; bruising: none; knee ROM: normal, with reported discomfort CV: brisk extremity capillary refill of RLE Skin: warm and dry; no visible rashes Neurologic: gait normal; normal sensation and strength of bilateral LE Psychological: alert and cooperative; normal mood and affect  Imaging: DG Knee Complete 4 Views Right  Result Date: 08/22/2022 CLINICAL DATA:  Trauma with right knee pain EXAM: RIGHT KNEE - COMPLETE 4 VIEW COMPARISON:  None Available. FINDINGS: There are no findings of fracture or dislocation. Small joint effusion. There is no evidence of arthropathy or other focal bone abnormality. Soft tissues are unremarkable. IMPRESSION: Small joint effusion. No fracture or dislocation. Electronically Signed   By: Darrin Nipper M.D.   On: 08/22/2022 11:04      No Known Allergies  History reviewed. No pertinent past medical history. Social History   Socioeconomic History   Marital status: Married    Spouse name: Not on file   Number of children: Not on file   Years of education: Not on file   Highest education level: Not on file  Occupational History   Not on file  Tobacco Use   Smoking status: Never   Smokeless tobacco: Never  Substance and Sexual Activity   Alcohol use: No   Drug use: No   Sexual activity: Not on file  Other Topics Concern   Not on file  Social History Narrative   Not on file   Social Determinants of Health  Financial Resource Strain: Not on file  Food Insecurity: Not on file  Transportation Needs: Not on file  Physical Activity: Not on file  Stress: Not on file  Social Connections: Not on file   History reviewed. No pertinent family history. History reviewed. No pertinent surgical history.     Vanessa Kick, MD 08/22/22 1537

## 2022-08-22 NOTE — ED Triage Notes (Signed)
Pt c/o right knee pain onset ~ 2 weeks ago causing changes in gait "limping." Pt thinks he hit it on a ladder but not sure. Says it is most noticeable when ambulating/applying pressure. Has been trying motrin without total relief. Says it did provide some relief.

## 2022-10-18 ENCOUNTER — Ambulatory Visit
Admission: RE | Admit: 2022-10-18 | Discharge: 2022-10-18 | Disposition: A | Discharge status: Home / Self Care | Payer: BC Managed Care – PPO | Source: Ambulatory Visit | Attending: Family Medicine | Admitting: Family Medicine

## 2022-10-18 VITALS — BP 162/110 | HR 104 | Temp 98.2°F | Resp 18

## 2022-10-18 DIAGNOSIS — M25561 Pain in right knee: Secondary | ICD-10-CM

## 2022-10-18 MED ORDER — DICLOFENAC SODIUM 75 MG PO TBEC: 75.0000 mg | DELAYED_RELEASE_TABLET | Freq: Two times a day (BID) | ORAL | 0 refills | Status: DC | PRN

## 2022-10-18 MED ORDER — AZITHROMYCIN 250 MG PO TABS: ORAL_TABLET | ORAL | 0 refills | Status: DC

## 2022-10-18 MED ORDER — PREDNISONE 20 MG PO TABS: 40.0000 mg | ORAL_TABLET | Freq: Every day | ORAL | 0 refills | Status: AC

## 2022-10-18 NOTE — ED Triage Notes (Signed)
Pt presents for follow up with ongoing right knee pain and difficulty with ROM after a work place injury about a month ago.

## 2022-10-18 NOTE — ED Provider Notes (Signed)
[facility]    CSN: [id number] Arrival date & time: 10/18/22  4098      History   Chief Complaint Chief Complaint  Patient presents with   Follow-up    HPI Randy Perez is a 37 y.o. male.   HPI ` Here for continued right knee pain and no swelling.  It late February he was seen here for right knee pain.  He had fallen onto a step of his ladder when he was working.  He improved with the prednisone and diclofenac, and then it is worsened again; now it hurts to bend his knee, and that makes it hard to climb ladders at work and stairs at home. History reviewed. No pertinent past medical history.  There are no problems to display for this patient.   History reviewed. No pertinent surgical history.     Home Medications    Prior to Admission medications   Medication Sig Start Date End Date Taking? Authorizing Provider  diclofenac (VOLTAREN) 75 MG EC tablet Take 1 tablet (75 mg total) by mouth 2 (two) times daily as needed (pain). 10/18/22  Yes Zenia Resides, MD  predniSONE (DELTASONE) 20 MG tablet Take 2 tablets (40 mg total) by mouth daily with breakfast for 5 days. 10/18/22 10/23/22 Yes Zenia Resides, MD    Family History History reviewed. No pertinent family history.  Social History Social History   Tobacco Use   Smoking status: Never   Smokeless tobacco: Never  Substance Use Topics   Alcohol use: No   Drug use: No     Allergies   Patient has no known allergies.   Review of Systems Review of Systems   Physical Exam Triage Vital Signs ED Triage Vitals  Enc Vitals Group     BP 10/18/22 0917 (!) 162/110     Pulse Rate 10/18/22 0917 (!) 104     Resp 10/18/22 0917 18     Temp 10/18/22 0917 98.2 F (36.8 C)     Temp Source 10/18/22 0917 Oral     SpO2 10/18/22 0917 98 %     Weight --      Height --      Head Circumference --      Peak Flow --      Pain Score 10/18/22 0916 5     Pain Loc --      Pain Edu? --      Excl. in GC?  --    No data found.  Updated Vital Signs BP (!) 162/110 (BP Location: Left Arm)   Pulse (!) 104   Temp 98.2 F (36.8 C) (Oral)   Resp 18   SpO2 98%   Visual Acuity Right Eye Distance:   Left Eye Distance:   Bilateral Distance:    Right Eye Near:   Left Eye Near:    Bilateral Near:     Physical Exam Vitals reviewed.  Constitutional:      General: He is not in acute distress.    Appearance: He is not ill-appearing, toxic-appearing or diaphoretic.  Musculoskeletal:     Comments: There is some swelling and tenderness of the superior knee on the right.  He has a knee brace on    Neurological:     General: No focal deficit present.     Mental Status: He is alert and oriented to person, place, and time.  Psychiatric:        Behavior: Behavior normal.      UC Treatments /  Results  Labs (all labs ordered are listed, but only abnormal results are displayed) Labs Reviewed - No data to display  EKG   Radiology No results found.  Procedures Procedures (including critical care time)  Medications Ordered in UC Medications - No data to display  Initial Impression / Assessment and Plan / UC Course  I have reviewed the triage vital signs and the nursing notes.  Pertinent labs & imaging results that were available during my care of the patient were reviewed by me and considered in my medical decision making (see chart for details).        Another course of prednisone and diclofenac are sent in for him.  I did caution him on not taking the prednisone too frequently in the future.  He will follow-up with Workmen's [employer]. physician Final Clinical Impressions(s) / UC Diagnoses   Final diagnoses:  Acute pain of right knee     Discharge Instructions      Diclofenac 75 mg--1 tablet 2 times daily as needed for pain  Take prednisone 20 mg--2 daily for 5 days       ED Prescriptions     Medication Sig Dispense Auth. Provider   azithromycin (ZITHROMAX) 250 MG  tablet  (Status: Discontinued) Take first 2 tablets together, then 1 every day until finished. 6 tablet Zenia Resides, MD   predniSONE (DELTASONE) 20 MG tablet Take 2 tablets (40 mg total) by mouth daily with breakfast for 5 days. 10 tablet Zenia Resides, MD   diclofenac (VOLTAREN) 75 MG EC tablet Take 1 tablet (75 mg total) by mouth 2 (two) times daily as needed (pain). 60 tablet Randee Huston, Janace Aris, MD      PDMP not reviewed this encounter.   Zenia Resides, MD 10/18/22 [phone]

## 2022-10-18 NOTE — Discharge Instructions (Addendum)
Diclofenac 75 mg--1 tablet 2 times daily as needed for pain  Take prednisone 20 mg--2 daily for 5 days

## 2023-08-14 ENCOUNTER — Encounter: Payer: Self-pay | Admitting: Emergency Medicine

## 2023-08-14 ENCOUNTER — Ambulatory Visit
Admission: EM | Admit: 2023-08-14 | Discharge: 2023-08-14 | Disposition: A | Discharge status: Home / Self Care | Payer: BC Managed Care – PPO | Attending: Physician Assistant | Admitting: Physician Assistant

## 2023-08-14 DIAGNOSIS — K0889 Other specified disorders of teeth and supporting structures: Secondary | ICD-10-CM

## 2023-08-14 DIAGNOSIS — K047 Periapical abscess without sinus: Secondary | ICD-10-CM | POA: Diagnosis not present

## 2023-08-14 MED ORDER — AMOXICILLIN-POT CLAVULANATE 875-125 MG PO TABS: 1.0000 | ORAL_TABLET | Freq: Two times a day (BID) | ORAL | 0 refills | Status: AC

## 2023-08-14 MED ORDER — IBUPROFEN 600 MG PO TABS: 600.0000 mg | ORAL_TABLET | Freq: Three times a day (TID) | ORAL | 0 refills | Status: AC | PRN

## 2023-08-14 NOTE — ED Triage Notes (Signed)
Pt stated he was eating some chips on Monday and one of the chips damaged his gums on right side in back of mouth. There is lots of swelling and pain which is keeping pt from eating, talking, sleeping.

## 2023-08-14 NOTE — Discharge Instructions (Signed)
Start Augmentin twice daily for 10 days.  Use ibuprofen 600 mg up to 3 times a day for pain relief.  You should avoid NSAIDs including aspirin, ibuprofen/Advil, naproxen/Aleve with this medication as it causes stomach bleeding.  You can use Tylenol for breakthrough pain.  Gargle with warm salt water for additional symptom relief.  If you develop any worsening symptoms including difficulty swallowing, difficulty speaking, swelling of your throat, high fever, change in your voice you need to be seen immediately.

## 2023-08-14 NOTE — ED Provider Notes (Signed)
[facility]    CSN: [id number] Arrival date & time: 08/14/23  0956      History   Chief Complaint Chief Complaint  Patient presents with   Dental Pain    HPI Randy Perez is a 38 y.o. male.   Patient presents today with a 2-day history of worsening right lower jaw pain and swelling.  He reports associated headache but denies any fever, nausea, vomiting.  Denies any swelling of his throat, shortness of breath, muffled voice.  Reports symptoms began after he was eating a potato chip that lodged itself in between his teeth.  He was able to floss out the foreign body but has had ongoing bleeding and pain in this area since that time.  Denies any recent dental procedure and does not follow with a dentist regularly.  He has tried Excedrin, acetaminophen, ibuprofen with temporary improvement of symptoms.  He denies any recent antibiotics.  He had difficulty sleeping because of the pain and so left work when the pain persisted/worsened.  He reports that currently pain is rated 10 on a 0-10 pain scale, described as throbbing, worse with palpation, no alleviating factors identified.    History reviewed. No pertinent past medical history.  There are no active problems to display for this patient.   History reviewed. No pertinent surgical history.     Home Medications    Prior to Admission medications   Medication Sig Start Date End Date Taking? Authorizing Provider  amoxicillin-clavulanate (AUGMENTIN) 875-125 MG tablet Take 1 tablet by mouth every 12 (twelve) hours. 08/14/23  Yes Lakely Elmendorf K, PA-C  ibuprofen (ADVIL) 600 MG tablet Take 1 tablet (600 mg total) by mouth every 8 (eight) hours as needed. 08/14/23  Yes Alexxa Sabet, Noberto Retort, PA-C    Family History History reviewed. No pertinent family history.  Social History Social History   Tobacco Use   Smoking status: Never    Passive exposure: Never   Smokeless tobacco: Never  Vaping Use   Vaping status: Never Used   Substance Use Topics   Alcohol use: No   Drug use: No     Allergies   Patient has no known allergies.   Review of Systems Review of Systems  Constitutional:  Positive for activity change. Negative for appetite change, fatigue and fever.  HENT:  Positive for dental problem and facial swelling. Negative for congestion, sinus pressure, sneezing, sore throat, trouble swallowing and voice change.   Respiratory:  Negative for cough and shortness of breath.   Cardiovascular:  Negative for chest pain.  Gastrointestinal:  Negative for abdominal pain, diarrhea, nausea and vomiting.  Neurological:  Positive for headaches. Negative for dizziness and light-headedness.     Physical Exam Triage Vital Signs ED Triage Vitals  Encounter Vitals Group     BP 08/14/23 1310 (!) 149/90     Systolic BP Percentile --      Diastolic BP Percentile --      Pulse Rate 08/14/23 1310 91     Resp 08/14/23 1310 18     Temp 08/14/23 1310 98 F (36.7 C)     Temp Source 08/14/23 1310 Oral     SpO2 08/14/23 1310 98 %     Weight 08/14/23 1308 279 lb 12.2 oz (126.9 kg)     Height 08/14/23 1308 5\' 8"  (1.727 m)     Head Circumference --      Peak Flow --      Pain Score 08/14/23 1308 10  Pain Loc --      Pain Education --      Exclude from Growth Chart --    No data found.  Updated Vital Signs BP (!) 149/90 (BP Location: Left Arm)   Pulse 91   Temp 98 F (36.7 C) (Oral)   Resp 18   Ht 5\' 8"  (1.727 m)   Wt 279 lb 12.2 oz (126.9 kg)   SpO2 98%   BMI 42.54 kg/m   Visual Acuity Right Eye Distance:   Left Eye Distance:   Bilateral Distance:    Right Eye Near:   Left Eye Near:    Bilateral Near:     Physical Exam Vitals reviewed.  Constitutional:      General: He is awake.     Appearance: Normal appearance. He is well-developed. He is not ill-appearing.     Comments: Very pleasant male appears stated age in no acute distress sitting comfortably in exam room  HENT:     Head:  Normocephalic and atraumatic.     Right Ear: Tympanic membrane, ear canal and external ear normal. Tympanic membrane is not erythematous or bulging.     Left Ear: Tympanic membrane, ear canal and external ear normal. Tympanic membrane is not erythematous or bulging.     Nose: Nose normal.     Mouth/Throat:     Dentition: Abnormal dentition. Dental tenderness, gingival swelling and dental abscesses present.     Pharynx: Uvula midline. No oropharyngeal exudate, posterior oropharyngeal erythema or uvula swelling.      Comments: No evidence of Ludwig angina Cardiovascular:     Rate and Rhythm: Normal rate and regular rhythm.     Heart sounds: Normal heart sounds, S1 normal and S2 normal. No murmur heard. Pulmonary:     Effort: Pulmonary effort is normal. No accessory muscle usage or respiratory distress.     Breath sounds: Normal breath sounds. No stridor. No wheezing, rhonchi or rales.     Comments: Clear to auscultation bilaterally Neurological:     Mental Status: He is alert.  Psychiatric:        Behavior: Behavior is cooperative.      UC Treatments / Results  Labs (all labs ordered are listed, but only abnormal results are displayed) Labs Reviewed - No data to display  EKG   Radiology No results found.  Procedures Procedures (including critical care time)  Medications Ordered in UC Medications - No data to display  Initial Impression / Assessment and Plan / UC Course  I have reviewed the triage vital signs and the nursing notes.  Pertinent labs & imaging results that were available during my care of the patient were reviewed by me and considered in my medical decision making (see chart for details).     Patient is well-appearing, afebrile, nontoxic, nontachycardic.  No indication for emergent evaluation or imaging.  Patient was treated for dental infection with Augmentin twice daily for 10 days.  He was given ibuprofen 600 mg for pain relief with instruction to take  this with food to prevent GI upset.  He is to avoid use of additional NSAIDs.  Can use Tylenol for breakthrough pain.  Recommend he gargle with warm salt water for additional symptom relief.  If his symptoms are not improving quickly he is to follow-up with a dentist and was given the contact information for local provider with instruction to call to schedule an appointment.  If he develops any worsening symptoms including fever, nausea, vomiting, swelling of  his throat, muffled voice, dysphagia he needs to go to the emergency room to which he expressed understanding.  Work excuse note provided.   Final Clinical Impressions(s) / UC Diagnoses   Final diagnoses:  Dental infection  Dentalgia     Discharge Instructions      Start Augmentin twice daily for 10 days.  Use ibuprofen 600 mg up to 3 times a day for pain relief.  You should avoid NSAIDs including aspirin, ibuprofen/Advil, naproxen/Aleve with this medication as it causes stomach bleeding.  You can use Tylenol for breakthrough pain.  Gargle with warm salt water for additional symptom relief.  If you develop any worsening symptoms including difficulty swallowing, difficulty speaking, swelling of your throat, high fever, change in your voice you need to be seen immediately.      ED Prescriptions     Medication Sig Dispense Auth. Provider   amoxicillin-clavulanate (AUGMENTIN) 875-125 MG tablet Take 1 tablet by mouth every 12 (twelve) hours. 20 tablet Tanijah Morais K, PA-C   ibuprofen (ADVIL) 600 MG tablet Take 1 tablet (600 mg total) by mouth every 8 (eight) hours as needed. 30 tablet Ariela Mochizuki, Noberto Retort, PA-C      PDMP not reviewed this encounter.   Jeani Hawking, PA-C 08/14/23 1410
